# Patient Record
Sex: Female | Born: 2000 | Race: White | Marital: Single | Smoking: Never smoker
Health system: Southern US, Community
[De-identification: ages and names within clinical notes are randomized; demographics above are authoritative.]

## PROBLEM LIST (undated history)

## (undated) DIAGNOSIS — F419 Anxiety disorder, unspecified: Secondary | ICD-10-CM

## (undated) HISTORY — DX: Anxiety disorder, unspecified: F41.9

---
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
## 2020-05-13 DIAGNOSIS — Z20822 Contact with and (suspected) exposure to covid-19: Secondary | ICD-10-CM | POA: Diagnosis not present

## 2020-07-19 ENCOUNTER — Ambulatory Visit: Payer: Self-pay

## 2020-07-19 NOTE — Telephone Encounter (Signed)
Mother reports pt. Is away at college and has been having "dizziness and 2 fainting episodes at school." Not witnessed. Pt. Has refused to seek medical attention at school.Seems to happen with changing positions.No other symptoms. Wants to be seen when she comes home. Appointment made. Instructed Mom to encourage pt. To be seen at school if this happens again. Verbalizes understanding. Reason for Disposition  Simple fainting is a chronic symptom (has occurred multiple times)  Answer Assessment - Initial Assessment Questions 1. ONSET: "How long were you unconscious?" (minutes) "When did it happen?"     Unsure 2. CONTENT: "What happened during period of unconsciousness?" (e.g., seizure activity)     No seizure 3. MENTAL STATUS: "Alert and oriented now?" (oriented x 3 = name, month, location)      Mother not with pt. 4. TRIGGER: "What do you think caused the fainting?" "What were you doing just before you fainted?"  (e.g., exercise, sudden standing up, prolonged standing)     Standing 5. RECURRENT SYMPTOM: "Have you ever passed out before?" If Yes, ask: "When was the last time?" and "What happened that time?"      Yes 6. INJURY: "Did you sustain any injury during the fall?"      No 7. CARDIAC SYMPTOMS: "Have you had any of the following symptoms: chest pain, difficulty breathing, palpitations?"     No 8. NEUROLOGIC SYMPTOMS: "Have you had any of the following symptoms: headache, numbness, vertigo, weakness?"     Dizzy 9. GI SYMPTOMS: "Have you had any of the following symptoms: abdominal pain, vomiting, diarrhea, blood in stools?"     No 10. OTHER SYMPTOMS: "Do you have any other symptoms?"       No 11. PREGNANCY: "Is there any chance you are pregnant?" "When was your last menstrual period?"       No  Protocols used: [facility]

## 2020-07-26 ENCOUNTER — Ambulatory Visit: Payer: BC Managed Care – PPO | Admitting: Physician Assistant

## 2020-07-26 ENCOUNTER — Encounter: Payer: Self-pay | Admitting: Physician Assistant

## 2020-07-26 ENCOUNTER — Other Ambulatory Visit: Payer: Self-pay

## 2020-07-26 VITALS — BP 117/70 | HR 77 | Temp 98.5°F | Resp 16 | Wt 170.4 lb

## 2020-07-26 DIAGNOSIS — R55 Syncope and collapse: Secondary | ICD-10-CM | POA: Diagnosis not present

## 2020-07-26 DIAGNOSIS — Z23 Encounter for immunization: Secondary | ICD-10-CM

## 2020-07-26 DIAGNOSIS — R42 Dizziness and giddiness: Secondary | ICD-10-CM

## 2020-07-26 NOTE — Patient Instructions (Signed)

## 2020-07-26 NOTE — Progress Notes (Signed)
Established patient visit   Patient: Brianna Hardin   DOB: [birth year]   19 y.o. Female  MRN: [id number] Visit Date: 07/26/2020  Today's healthcare provider: Margaretann Loveless, PA-C   No chief complaint on file.  Subjective    HPI  Patient here with c/o fainting. She has had 3 episodes. First one was back in October. Second one was a month ago and the last one was 2 weeks ago. Reports that it has happened when she stands up then she starts to feel lightheaded.   There are no problems to display for this patient.  No past medical history on file.     Medications: Outpatient Medications Prior to Visit  Medication Sig  . Cholecalciferol (VITAMIN D-3 PO) Take by mouth.   No facility-administered medications prior to visit.    Review of Systems  Constitutional: Negative.   Respiratory: Negative.   Cardiovascular: Negative.   Neurological: Positive for syncope and light-headedness.    Last CBC Lab Results  Component Value Date   WBC 6.7 07/02/2019   HGB 13.4 07/02/2019   HCT 40.8 07/02/2019   MCV 92 07/02/2019   MCH 30.0 07/02/2019   RDW 11.9 07/02/2019   PLT 219 07/02/2019   Last metabolic panel Lab Results  Component Value Date   GLUCOSE 85 07/02/2019   NA 142 07/02/2019   K 4.6 07/02/2019   CL 103 07/02/2019   CO2 22 07/02/2019   BUN 19 07/02/2019   CREATININE 0.91 07/02/2019   GFRNONAA 92 07/02/2019   GFRAA 107 07/02/2019   CALCIUM 9.7 07/02/2019   PROT 7.0 07/02/2019   ALBUMIN 4.8 07/02/2019   LABGLOB 2.2 07/02/2019   AGRATIO 2.2 07/02/2019   BILITOT 0.5 07/02/2019   ALKPHOS 68 07/02/2019   AST 16 07/02/2019   ALT 13 07/02/2019      Objective    BP 117/70 (BP Location: Right Arm, Patient Position: Sitting, Cuff Size: Large)   Pulse 77   Temp 98.5 F (36.9 C) (Oral)   Resp 16   Wt 170 lb 6.4 oz (77.3 kg)   BMI 22.48 kg/m  BP Readings from Last 3 Encounters:  07/26/20 117/70  07/02/19 121/71  06/17/18 (!) 110/60 (37 %, Z = -0.33 /   17 %, Z = -0.95)*   *BP percentiles are based on the 2017 AAP Clinical Practice Guideline for girls   Wt Readings from Last 3 Encounters:  07/26/20 170 lb 6.4 oz (77.3 kg) (92 %, Z= 1.42)*  07/02/19 183 lb (83 kg) (96 %, Z= 1.72)*  06/17/18 183 lb 3.2 oz (83.1 kg) (96 %, Z= 1.77)*   * Growth percentiles are based on CDC (Girls, 2-20 Years) data.      Physical Exam Vitals reviewed.  Constitutional:      General: She is not in acute distress.    Appearance: Normal appearance. She is well-developed, normal weight and well-nourished. She is not ill-appearing or diaphoretic.  Cardiovascular:     Rate and Rhythm: Normal rate and regular rhythm.     Heart sounds: Normal heart sounds. No murmur heard. No friction rub. No gallop.   Pulmonary:     Effort: Pulmonary effort is normal. No respiratory distress.     Breath sounds: Normal breath sounds. No wheezing or rales.  Musculoskeletal:     Cervical back: Normal range of motion and neck supple.  Neurological:     General: No focal deficit present.     Mental Status: She is  alert and oriented to person, place, and time. Mental status is at baseline.     Orthostatic VS for the past 24 hrs (Last 3 readings):  BP- Lying Pulse- Lying BP- Sitting Pulse- Sitting BP- Standing at 0 minutes Pulse- Standing at 0 minutes  07/26/20 1439 113/67 69 124/68 80 121/67 85     No results found for any visits on 07/26/20.  Assessment & Plan     1. Syncope, unspecified syncope type Suspect orthostatic hypotension due to mechanism of standing being onset of the 3 syncopal episodes. No orthostasis noted on exam today. Advised to push fluids. Can consider compression stockings while awake. Also discussed changing positions slowly. Will refer to Cardiology for further evaluation and consideration of ruling out any dysautonomia, consult appreciated. Will check labs as below and f/u pending results. - Ambulatory referral to Cardiology - CBC w/Diff/Platelet -  Comprehensive Metabolic Panel (CMET) - TSH  2. Lightheadedness See above medical treatment plan. - Ambulatory referral to Cardiology - CBC w/Diff/Platelet - Comprehensive Metabolic Panel (CMET) - TSH  3. Need for influenza vaccination Flu vaccine already received. Unsure of date.   No follow-ups on file.      Delmer Islam, PA-C, have reviewed all documentation for this visit. The documentation on 07/26/20 for the exam, diagnosis, procedures, and orders are all accurate and complete.   Reine Just  [facility] [phone] (phone) [phone] (fax)  [facility]

## 2020-07-27 LAB — CBC WITH DIFFERENTIAL/PLATELET
Basophils Absolute: 0.1 10*3/uL (ref 0.0–0.2)
Basos: 1 %
EOS (ABSOLUTE): 0.1 10*3/uL (ref 0.0–0.4)
Eos: 1 %
Hematocrit: 41.3 % (ref 34.0–46.6)
Hemoglobin: 13.8 g/dL (ref 11.1–15.9)
Immature Grans (Abs): 0 10*3/uL (ref 0.0–0.1)
Immature Granulocytes: 0 %
Lymphocytes Absolute: 2.5 10*3/uL (ref 0.7–3.1)
Lymphs: 38 %
MCH: 29.7 pg (ref 26.6–33.0)
MCHC: 33.4 g/dL (ref 31.5–35.7)
MCV: 89 fL (ref 79–97)
Monocytes Absolute: 0.5 10*3/uL (ref 0.1–0.9)
Monocytes: 8 %
Neutrophils Absolute: 3.4 10*3/uL (ref 1.4–7.0)
Neutrophils: 52 %
Platelets: 220 10*3/uL (ref 150–450)
RBC: 4.64 x10E6/uL (ref 3.77–5.28)
RDW: 12.2 % (ref 11.7–15.4)
WBC: 6.6 10*3/uL (ref 3.4–10.8)

## 2020-07-27 LAB — COMPREHENSIVE METABOLIC PANEL
ALT: 10 IU/L (ref 0–32)
AST: 16 IU/L (ref 0–40)
Albumin/Globulin Ratio: 2.2 (ref 1.2–2.2)
Albumin: 4.8 g/dL (ref 3.9–5.0)
Alkaline Phosphatase: 72 IU/L (ref 42–106)
BUN/Creatinine Ratio: 13 (ref 9–23)
BUN: 12 mg/dL (ref 6–20)
Bilirubin Total: 0.4 mg/dL (ref 0.0–1.2)
CO2: 21 mmol/L (ref 20–29)
Calcium: 9.4 mg/dL (ref 8.7–10.2)
Chloride: 105 mmol/L (ref 96–106)
Creatinine, Ser: 0.93 mg/dL (ref 0.57–1.00)
GFR calc Af Amer: 103 mL/min/{1.73_m2} (ref >59)
GFR calc non Af Amer: 89 mL/min/{1.73_m2} (ref >59)
Globulin, Total: 2.2 g/dL (ref 1.5–4.5)
Glucose: 82 mg/dL (ref 65–99)
Potassium: 4.8 mmol/L (ref 3.5–5.2)
Sodium: 141 mmol/L (ref 134–144)
Total Protein: 7 g/dL (ref 6.0–8.5)

## 2020-07-27 LAB — TSH: TSH: 1.5 u[IU]/mL (ref 0.450–4.500)

## 2020-07-29 ENCOUNTER — Encounter: Payer: BC Managed Care – PPO | Admitting: Physician Assistant

## 2020-07-29 ENCOUNTER — Ambulatory Visit (INDEPENDENT_AMBULATORY_CARE_PROVIDER_SITE_OTHER): Payer: BC Managed Care – PPO

## 2020-07-29 ENCOUNTER — Other Ambulatory Visit: Payer: Self-pay

## 2020-07-29 ENCOUNTER — Ambulatory Visit: Payer: BC Managed Care – PPO | Admitting: Cardiology

## 2020-07-29 ENCOUNTER — Encounter: Payer: Self-pay | Admitting: Cardiology

## 2020-07-29 VITALS — BP 122/71 | HR 59 | Ht 73.0 in | Wt 170.0 lb

## 2020-07-29 DIAGNOSIS — R55 Syncope and collapse: Secondary | ICD-10-CM | POA: Diagnosis not present

## 2020-07-29 NOTE — Progress Notes (Signed)
Cardiology Office Note:    Date:  07/29/2020   ID:  Brianna Hardin, DOB May 03, 2001, MRN [id number]  PCP:  Margaretann Loveless, PA-C  [facility] Cardiologist:  No primary care provider on file.  [facility] Electrophysiologist:  None   Referring MD: Suella Grove*   Chief Complaint  Patient presents with  . NEW patient-Referred by JenniferBurnette, PA-C    Patient reports syncopal episode about 1 month ago   Brianna Hardin is a 19 y.o. female who is being seen today for the evaluation of syncope at the request of Margaretann Loveless, P*.   History of Present Illness:    Brianna Hardin is a 19 y.o. female with no significant medical history who presents due to syncope.  Episode of syncope occurred about a month ago.  Patient was starting for an exam when she passed out upon standing.  Denies any palpitations, chest pain or shortness of breath.  She states feeling dizzy at the time, tried to grab on to the door but later found herself on the floor.  She denies any prior episodes, denies any recurrent episodes since.  Denies any history of heart disease.  She is pretty active, loves to swim.  History reviewed. No pertinent past medical history.  Past Surgical History:  Procedure Laterality Date  . TONSILLECTOMY  2007    Current Medications: No outpatient medications have been marked as taking for the 07/29/20 encounter (Office Visit) with Debbe Odea, MD.     Allergies:   Patient has no known allergies.   Social History   Socioeconomic History  . Marital status: Single    Spouse name: Not on file  . Number of children: Not on file  . Years of education: Not on file  . Highest education level: Not on file  Occupational History  . Not on file  Tobacco Use  . Smoking status: Never Smoker  . Smokeless tobacco: Never Used  Vaping Use  . Vaping Use: Never used  Substance and Sexual Activity  . Alcohol use: No  . Drug use: No  . Sexual activity:  Never  Other Topics Concern  . Not on file  Social History Narrative  . Not on file   Social Determinants of Health   Financial Resource Strain: Not on file  Food Insecurity: Not on file  Transportation Needs: Not on file  Physical Activity: Not on file  Stress: Not on file  Social Connections: Not on file     Family History: The patient's family history includes Anxiety disorder in her mother; Cancer in her father, maternal grandfather, maternal grandmother, mother, paternal grandfather, and paternal grandmother; Depression in her maternal grandfather, maternal grandmother, mother, and paternal aunt; Diabetes in her father, maternal grandfather, paternal grandfather, paternal grandmother, and paternal uncle; GER disease in her maternal grandmother and paternal grandmother; Heart disease in her maternal grandfather; Hyperlipidemia in her maternal grandfather and maternal grandmother.  ROS:   Please see the history of present illness.     All other systems reviewed and are negative.  EKGs/Labs/Other Studies Reviewed:    The following studies were reviewed today:   EKG:  EKG is  ordered today.  The ekg ordered today demonstrates sinus bradycardia, otherwise normal  Recent Labs: 07/26/2020: ALT 10; BUN 12; Creatinine, Ser 0.93; Hemoglobin 13.8; Platelets 220; Potassium 4.8; Sodium 141; TSH 1.500  Recent Lipid Panel    Component Value Date/Time   CHOL 135 07/02/2019 1033   TRIG 62  07/02/2019 1033   HDL 48 07/02/2019 1033   CHOLHDL 2.8 07/02/2019 1033   LDLCALC 74 07/02/2019 1033     Risk Assessment/Calculations:      Physical Exam:    VS:  BP 122/71 (BP Location: Right Arm, Patient Position: Sitting, Cuff Size: Normal)   Pulse (!) 59   Ht 6\' 1"  (1.854 m)   Wt 170 lb (77.1 kg)   SpO2 99%   BMI 22.43 kg/m     Wt Readings from Last 3 Encounters:  07/29/20 170 lb (77.1 kg) (92 %, Z= 1.41)*  07/26/20 170 lb 6.4 oz (77.3 kg) (92 %, Z= 1.42)*  07/02/19 183 lb (83 kg)  (96 %, Z= 1.72)*   * Growth percentiles are based on CDC (Girls, 2-20 Years) data.     GEN:  Well nourished, well developed in no acute distress HEENT: Normal NECK: No JVD; No carotid bruits LYMPHATICS: No lymphadenopathy CARDIAC: RRR, no murmurs, rubs, gallops RESPIRATORY:  Clear to auscultation without rales, wheezing or rhonchi  ABDOMEN: Soft, non-tender, non-distended MUSCULOSKELETAL:  No edema; No deformity  SKIN: Warm and dry NEUROLOGIC:  Alert and oriented x 3 PSYCHIATRIC:  Normal affect   ASSESSMENT:    1. Syncope and collapse    PLAN:    In order of problems listed above:  1. Patient with one episode of syncope.  Denies prior or recurrent episodes.  No palpitations, chest pain.  Felt dizzy at the time suggesting orthostasis.  Orthostatic vitals in the office today without any evidence for orthostasis.  Cardiac exam normal.  EKG unrevealing.  Will place a cardiac monitor to evaluate any significant arrhythmias.  If this is normal, recommend close monitoring.  She is young, with no cardiac risk factors.  Will call patient after cardiac monitor results are reviewed.  Further management depending on monitor results.     Medication Adjustments/Labs and Tests Ordered: Current medicines are reviewed at length with the patient today.  Concerns regarding medicines are outlined above.  Orders Placed This Encounter  Procedures  . LONG TERM MONITOR (3-14 DAYS)  . EKG 12-Lead   No orders of the defined types were placed in this encounter.   Patient Instructions  Medications:  Your physician recommends that you continue on your current medications as directed. Please refer to the Current Medication list given to you today.   Lab Work: None Ordered If you have labs (blood work) drawn today and your tests are completely normal, you will receive your results only by: 07/04/19 MyChart Message (if you have MyChart) OR . A paper copy in the mail If you have any lab test that is  abnormal or we need to change your treatment, we will call you to review the results.   Testing/Procedures:  Your physician has recommended that you wear a Zio monitor for 2 weeks. This monitor is a medical device that records the heart's electrical activity. Doctors most often use these monitors to diagnose arrhythmias. Arrhythmias are problems with the speed or rhythm of the heartbeat. The monitor is a small device applied to your chest. You can wear one while you do your normal daily activities. While wearing this monitor if you have any symptoms to push the button and record what you felt. Once you have worn this monitor for the period of time provider prescribed (Usually 14 days), you will return the monitor device in the postage paid box. Once it is returned they will download the data collected and provide Marland Kitchen with a  report which the provider will then review and we will call you with those results. Important tips:  1. Avoid showering during the first 24 hours of wearing the monitor. 2. Avoid excessive sweating to help maximize wear time. 3. Do not submerge the device, no hot tubs, and no swimming pools. 4. Keep any lotions or oils away from the patch. 5. After 24 hours you may shower with the patch on. Take brief showers with your back facing the shower head.  6. Do not remove patch once it has been placed because that will interrupt data and decrease adhesive wear time. 7. Push the button when you have any symptoms and write down what you were feeling. 8. Once you have completed wearing your monitor, remove and place into box which has postage paid and place in your outgoing mailbox.  9. If for some reason you have misplaced your box then call our office and we can provide another box and/or mail it off for you.         Follow-Up: At [facility], you and your health needs are our priority.  As part of our continuing mission to provide you with exceptional heart care, we have  created designated Provider Care Teams.  These Care Teams include your primary Cardiologist (physician) and Advanced Practice Providers (APPs -  Physician Assistants and Nurse Practitioners) who all work together to provide you with the care you need, when you need it.  We recommend signing up for the patient portal called "MyChart".  Sign up information is provided on this After Visit Summary.  MyChart is used to connect with patients for Virtual Visits (Telemedicine).  Patients are able to view lab/test results, encounter notes, upcoming appointments, etc.  Non-urgent messages can be sent to your provider as well.   To learn more about what you can do with MyChart, go to [email or web address].    Your next appointment:   Follow up as needed   The format for your next appointment:   In Person  Provider:   Debbe Odea, MD       Signed, Debbe Odea, MD  07/29/2020 12:50 PM    [facility]

## 2020-07-29 NOTE — Patient Instructions (Signed)
Medications:  Your physician recommends that you continue on your current medications as directed. Please refer to the Current Medication list given to you today.   Lab Work: None Ordered If you have labs (blood work) drawn today and your tests are completely normal, you will receive your results only by: Marland Kitchen MyChart Message (if you have MyChart) OR . A paper copy in the mail If you have any lab test that is abnormal or we need to change your treatment, we will call you to review the results.   Testing/Procedures:  Your physician has recommended that you wear a Zio monitor for 2 weeks. This monitor is a medical device that records the heart's electrical activity. Doctors most often use these monitors to diagnose arrhythmias. Arrhythmias are problems with the speed or rhythm of the heartbeat. The monitor is a small device applied to your chest. You can wear one while you do your normal daily activities. While wearing this monitor if you have any symptoms to push the button and record what you felt. Once you have worn this monitor for the period of time provider prescribed (Usually 14 days), you will return the monitor device in the postage paid box. Once it is returned they will download the data collected and provide [place] with a report which the provider will then review and we will call you with those results. Important tips:  1. Avoid showering during the first 24 hours of wearing the monitor. 2. Avoid excessive sweating to help maximize wear time. 3. Do not submerge the device, no hot tubs, and no swimming pools. 4. Keep any lotions or oils away from the patch. 5. After 24 hours you may shower with the patch on. Take brief showers with your back facing the shower head.  6. Do not remove patch once it has been placed because that will interrupt data and decrease adhesive wear time. 7. Push the button when you have any symptoms and write down what you were feeling. 8. Once you have completed  wearing your monitor, remove and place into box which has postage paid and place in your outgoing mailbox.  9. If for some reason you have misplaced your box then call our office and we can provide another box and/or mail it off for you.         Follow-Up: At [facility], you and your health needs are our priority.  As part of our continuing mission to provide you with exceptional heart care, we have created designated Provider Care Teams.  These Care Teams include your primary Cardiologist (physician) and Advanced Practice Providers (APPs -  Physician Assistants and Nurse Practitioners) who all work together to provide you with the care you need, when you need it.  We recommend signing up for the patient portal called "MyChart".  Sign up information is provided on this After Visit Summary.  MyChart is used to connect with patients for Virtual Visits (Telemedicine).  Patients are able to view lab/test results, encounter notes, upcoming appointments, etc.  Non-urgent messages can be sent to your provider as well.   To learn more about what you can do with MyChart, go to [email or web address].    Your next appointment:   Follow up as needed   The format for your next appointment:   In Person  Provider:   Debbe Odea, MD

## 2022-03-07 ENCOUNTER — Other Ambulatory Visit: Payer: Self-pay

## 2022-03-07 ENCOUNTER — Ambulatory Visit
Admission: RE | Admit: 2022-03-07 | Discharge: 2022-03-07 | Disposition: A | Discharge status: Home / Self Care | Payer: BC Managed Care – PPO | Source: Ambulatory Visit | Attending: Emergency Medicine | Admitting: Emergency Medicine

## 2022-03-07 VITALS — BP 110/73 | HR 89 | Temp 98.2°F | Resp 18

## 2022-03-07 DIAGNOSIS — J069 Acute upper respiratory infection, unspecified: Secondary | ICD-10-CM | POA: Diagnosis not present

## 2022-03-07 LAB — POCT RAPID STREP A (OFFICE): Rapid Strep A Screen: NEGATIVE

## 2022-03-07 LAB — POCT INFLUENZA A/B
Influenza A, POC: NEGATIVE
Influenza B, POC: NEGATIVE

## 2022-03-07 NOTE — ED Triage Notes (Signed)
Patient returned to [place] from [place] on 03/04/2022. Patient reports symptoms started last night, 03/06/2022.  Did a home covid test, reported negative. Complains of cough, fever (101), sore throat

## 2022-03-07 NOTE — ED Provider Notes (Signed)
Renaldo Fiddler    CSN: [id number] Arrival date & time: 03/07/22  8588      History   Chief Complaint Chief Complaint  Patient presents with   Chills    Just got back from vacation in [place] on Sunday.  Starting have fever, chills, sore throat late last night.  Home COVID test negative.  Wondering if I have influenza and if so could I be prescribed Xofluza to shorten my symtoms. - Entered by patient    HPI Arlanda E Norell is a 21 y.o. female.   Patient presents with fever, chills, nonproductive cough, bilateral ear pain ,sore throat beginning 1 day ago.  Tolerating food and liquids.  No known sick contacts but recently traveled from [place] via aircraft.  Has attempted use of Aleve which was minimally helpful.  Denies congestion, shortness of breath, wheezing, headaches.  History of a tonsillectomy.  Home COVID test negative  History reviewed. No pertinent past medical history.  There are no problems to display for this patient.   Past Surgical History:  Procedure Laterality Date   TONSILLECTOMY  2007    OB History   No obstetric history on file.      Home Medications    Prior to Admission medications   Not on File    Family History Family History  Problem Relation Age of Onset   Cancer Mother        Skin Cancer   Depression Mother    Anxiety disorder Mother    Diabetes Father        Type2   Cancer Father        skin cancer   Depression Paternal Aunt    Diabetes Paternal Uncle    Depression Maternal Grandmother    Hyperlipidemia Maternal Grandmother    GER disease Maternal Grandmother    Cancer Maternal Grandmother        skin   Cancer Maternal Grandfather    Diabetes Maternal Grandfather    Depression Maternal Grandfather    Heart disease Maternal Grandfather        CAD   Hyperlipidemia Maternal Grandfather    Diabetes Paternal Grandmother    Cancer Paternal Grandmother    GER disease Paternal Grandmother    Cancer Paternal Grandfather         Head and neck   Diabetes Paternal Grandfather     Social History Social History   Tobacco Use   Smoking status: Never   Smokeless tobacco: Never  Vaping Use   Vaping Use: Never used  Substance Use Topics   Alcohol use: No   Drug use: No     Allergies   Patient has no known allergies.   Review of Systems Review of Systems Defer to HPI   Physical Exam Triage Vital Signs ED Triage Vitals  Enc Vitals Group     BP 03/07/22 0944 110/73     Pulse Rate 03/07/22 0944 89     Resp 03/07/22 0944 18     Temp 03/07/22 0944 98.2 F (36.8 C)     Temp Source 03/07/22 0944 Oral     SpO2 03/07/22 0944 96 %     Weight --      Height --      Head Circumference --      Peak Flow --      Pain Score 03/07/22 0941 2     Pain Loc --      Pain Edu? --  Excl. in GC? --    No data found.  Updated Vital Signs BP 110/73 (BP Location: Left Arm)   Pulse 89   Temp 98.2 F (36.8 C) (Oral)   Resp 18   LMP 02/23/2022   SpO2 96%   Visual Acuity Right Eye Distance:   Left Eye Distance:   Bilateral Distance:    Right Eye Near:   Left Eye Near:    Bilateral Near:     Physical Exam Constitutional:      Appearance: Normal appearance.  HENT:     Head: Normocephalic.     Right Ear: Tympanic membrane, ear canal and external ear normal.     Left Ear: Tympanic membrane, ear canal and external ear normal.     Nose: Nose normal.     Mouth/Throat:     Mouth: Mucous membranes are moist.     Pharynx: Posterior oropharyngeal erythema present. No oropharyngeal exudate.  Eyes:     Extraocular Movements: Extraocular movements intact.  Cardiovascular:     Rate and Rhythm: Normal rate and regular rhythm.     Pulses: Normal pulses.     Heart sounds: Normal heart sounds.  Pulmonary:     Effort: Pulmonary effort is normal.     Breath sounds: Normal breath sounds.  Musculoskeletal:     Cervical back: Normal range of motion and neck supple.  Skin:    General: Skin is warm and dry.   Neurological:     Mental Status: She is alert and oriented to person, place, and time. Mental status is at baseline.  Psychiatric:        Mood and Affect: Mood normal.        Behavior: Behavior normal.      UC Treatments / Results  Labs (all labs ordered are listed, but only abnormal results are displayed) Labs Reviewed  CULTURE, GROUP A STREP [facility])  POCT RAPID STREP A (OFFICE)  POCT INFLUENZA A/B    EKG   Radiology No results found.  Procedures Procedures (including critical care time)  Medications Ordered in UC Medications - No data to display  Initial Impression / Assessment and Plan / UC Course  I have reviewed the triage vital signs and the nursing notes.  Pertinent labs & imaging results that were available during my care of the patient were reviewed by me and considered in my medical decision making (see chart for details).  Vital signs are stable patient is nontoxic appearing and in no signs of distress ,rapid strep and flu test are negative, discussed findings with patient, etiology is most likely viral.,  May follow-up with urgent as needed Final Clinical Impressions(s) / UC Diagnoses   Final diagnoses:  Viral URI with cough   Discharge Instructions   None    ED Prescriptions   None    PDMP not reviewed this encounter.   Valinda Hoar, NP 03/07/22 1025

## 2022-03-07 NOTE — Discharge Instructions (Signed)
Your symptoms today are most likely being caused by a virus and should steadily improve in time it can take up to 7 to 10 days before you truly start to see a turnaround however things will get better  Flu and strep test today are negative    You can take Tylenol and/or Ibuprofen as needed for fever reduction and pain relief.   For cough: honey 1/2 to 1 teaspoon (you can dilute the honey in water or another fluid).  You can also use guaifenesin and dextromethorphan for cough. You can use a humidifier for chest congestion and cough.  If you don't have a humidifier, you can sit in the bathroom with the hot shower running.      For sore throat: try warm salt water gargles, cepacol lozenges, throat spray, warm tea or water with lemon/honey, popsicles or ice, or OTC cold relief medicine for throat discomfort.   For congestion: take a daily anti-histamine like Zyrtec, Claritin, and a oral decongestant, such as pseudoephedrine.  You can also use Flonase 1-2 sprays in each nostril daily.   It is important to stay hydrated: drink plenty of fluids (water, gatorade/powerade/pedialyte, juices, or teas) to keep your throat moisturized and help further relieve irritation/discomfort.'

## 2022-03-10 LAB — CULTURE, GROUP A STREP (THRC)

## 2022-03-14 ENCOUNTER — Encounter: Payer: Self-pay | Admitting: Physician Assistant

## 2022-03-14 ENCOUNTER — Ambulatory Visit: Payer: BC Managed Care – PPO | Admitting: Physician Assistant

## 2022-03-14 VITALS — BP 115/76 | HR 78 | Temp 98.1°F | Ht 73.0 in | Wt 184.5 lb

## 2022-03-14 DIAGNOSIS — B309 Viral conjunctivitis, unspecified: Secondary | ICD-10-CM

## 2022-03-14 DIAGNOSIS — B09 Unspecified viral infection characterized by skin and mucous membrane lesions: Secondary | ICD-10-CM | POA: Diagnosis not present

## 2022-03-14 MED ORDER — POLYMYXIN B-TRIMETHOPRIM 10000-0.1 UNIT/ML-% OP SOLN: 1.0000 [drp] | Freq: Four times a day (QID) | OPHTHALMIC | 0 refills | Status: AC

## 2022-03-14 NOTE — Progress Notes (Signed)
     I,[name],acting as a Neurosurgeon for [employer], PA-C.,have documented all relevant documentation on the behalf of [name], PA-C,as directed by  [name], PA-C while in the presence of [name], PA-C.   Established patient visit   Patient: Brianna Hardin   DOB: October 25, 2000   21 y.o. Female  MRN: [id number] Visit Date: 03/14/2022  Today's healthcare provider: [name], PA-C   Cc. Uri symptoms  Subjective     Reports congestion ,headaches, rhinorrhea, starting about a week ago, along with a red and irritated right eye w/ some discharge in the morning. Reports a fever of 101 a few days ago. Feels her symptoms are improving. She noticed a few days after the fever was gone a red rash appeared on her legs. Denies pain, itching. Feels it is improving.    Medications: No outpatient medications prior to visit.   No facility-administered medications prior to visit.    Review of Systems  HENT:  Positive for congestion and rhinorrhea.   Eyes:  Positive for discharge and itching.  Skin:  Positive for rash.  Neurological:  Positive for headaches.      Objective    Blood pressure 115/76, pulse 78, temperature 98.1 F (36.7 C), temperature source Oral, height 6\' 1"  (1.854 m), weight 184 lb 8 oz (83.7 kg), last menstrual period 02/23/2022, SpO2 100 %.   Physical Exam Constitutional:      General: She is awake.     Appearance: She is well-developed.  HENT:     Head: Normocephalic.     Right Ear: Tympanic membrane normal.     Left Ear: Tympanic membrane normal.     Mouth/Throat:     Pharynx: Posterior oropharyngeal erythema present. No oropharyngeal exudate.  Eyes:     Conjunctiva/sclera:     Right eye: Right conjunctiva is injected. No exudate.    Pupils: Pupils are equal, round, and reactive to light.  Cardiovascular:     Rate and Rhythm: Normal rate and regular rhythm.     Heart sounds: Normal heart sounds.  Pulmonary:     Effort: Pulmonary  effort is normal.     Breath sounds: Normal breath sounds.  Skin:    General: Skin is warm.     Comments: B/l inner thighs with a faint lacy-macular rash  Neurological:     Mental Status: She is alert and oriented to person, place, and time.  Psychiatric:        Attention and Perception: Attention normal.        Mood and Affect: Mood normal.        Speech: Speech normal.        Behavior: Behavior is cooperative.     No results found for any visits on 03/14/22.  Assessment & Plan     Viral conjunctivitis Will send polytrim drops in case it worsens but for now manage with warm compresses  2. Viral exanthem Rash is nonspecific asymptomatic appeared after fever/viral illness Advised pt to monitor if progresses/spreads or becomes symptomatic to call office  Return if symptoms worsen or fail to improve.      I, 05/14/22, PA-C have reviewed all documentation for this visit. The documentation on  03/14/2022 for the exam, diagnosis, procedures, and orders are all accurate and complete.  05/14/2022, PA-C [facility] [address] [place], [place], [place] Office: [phone] Fax: [phone]   [facility]

## 2022-07-24 ENCOUNTER — Ambulatory Visit: Payer: BC Managed Care – PPO | Admitting: Physician Assistant

## 2022-07-24 ENCOUNTER — Encounter: Payer: Self-pay | Admitting: Physician Assistant

## 2022-07-24 ENCOUNTER — Ambulatory Visit (INDEPENDENT_AMBULATORY_CARE_PROVIDER_SITE_OTHER): Payer: BC Managed Care – PPO | Admitting: Physician Assistant

## 2022-07-24 VITALS — BP 127/81 | HR 74 | Temp 98.1°F | Ht 74.0 in | Wt 177.4 lb

## 2022-07-24 DIAGNOSIS — Z23 Encounter for immunization: Secondary | ICD-10-CM | POA: Diagnosis not present

## 2022-07-24 DIAGNOSIS — Z0000 Encounter for general adult medical examination without abnormal findings: Secondary | ICD-10-CM | POA: Diagnosis not present

## 2022-07-24 NOTE — Progress Notes (Signed)
I,Connie R Striblin,acting as a Education administrator for [employer], PA-C.,have documented all relevant documentation on the behalf of Mikey Kirschner, PA-C,as directed by  Mikey Kirschner, PA-C while in the presence of Mikey Kirschner, PA-C.  Complete physical exam   Patient: Brianna Hardin   DOB: May 24, 2001   21 y.o. Female  MRN: [id number] Visit Date: 07/24/2022  Today's healthcare provider: Mikey Kirschner, PA-C   Cc. cpe  Subjective    Brianna Hardin is a 21 y.o. female who presents today for a complete physical exam.  She reports consuming a general diet.  Swimming for exercise   She generally feels well. She reports sleeping well. She does not have additional problems to discuss today.   History reviewed. No pertinent past medical history. Past Surgical History:  Procedure Laterality Date   TONSILLECTOMY  2007   Social History   Socioeconomic History   Marital status: Single    Spouse name: Not on file   Number of children: Not on file   Years of education: Not on file   Highest education level: Not on file  Occupational History   Not on file  Tobacco Use   Smoking status: Never   Smokeless tobacco: Never  Vaping Use   Vaping Use: Never used  Substance and Sexual Activity   Alcohol use: No   Drug use: No   Sexual activity: Never    Birth control/protection: None  Other Topics Concern   Not on file  Social History Narrative   Not on file   Social Determinants of Health   Financial Resource Strain: Not on file  Food Insecurity: Not on file  Transportation Needs: Not on file  Physical Activity: Not on file  Stress: Not on file  Social Connections: Not on file  Intimate Partner Violence: Unknown (06/15/2017)   Humiliation, Afraid, Rape, and Kick questionnaire    Fear of Current or Ex-Partner: Patient refused    Emotionally Abused: Patient refused    Physically Abused: Patient refused    Sexually Abused: Patient refused   Family Status  Relation Name Status    Mother  Alive   Father  Alive   Ethlyn Daniels  (Not Specified)   Annamarie Major  (Not Specified)   MGM  (Not Specified)   MGF  (Not Specified)       MI   PGM  (Not Specified)   PGF  (Not Specified)   Family History  Problem Relation Age of Onset   Cancer Mother        Skin Cancer   Depression Mother    Anxiety disorder Mother    Diabetes Father        Type2   Cancer Father        skin cancer   Depression Paternal Aunt    Diabetes Paternal Uncle    Depression Maternal Grandmother    Hyperlipidemia Maternal Grandmother    GER disease Maternal Grandmother    Cancer Maternal Grandmother        skin   Cancer Maternal Grandfather    Diabetes Maternal Grandfather    Depression Maternal Grandfather    Heart disease Maternal Grandfather        CAD   Hyperlipidemia Maternal Grandfather    Diabetes Paternal Grandmother    Cancer Paternal Grandmother    GER disease Paternal Grandmother    Cancer Paternal Grandfather        Head and neck   Diabetes Paternal Grandfather  No Known Allergies  Patient Care Team: Mikey Kirschner, PA-C as PCP - General (Physician Assistant)   Medications: No outpatient medications prior to visit.   No facility-administered medications prior to visit.    Review of Systems  Constitutional:  Negative for fatigue and fever.  Respiratory:  Negative for cough and shortness of breath.   Cardiovascular:  Negative for chest pain and leg swelling.  Gastrointestinal:  Negative for abdominal pain.  Neurological:  Negative for dizziness and headaches.     Objective    Blood pressure 127/81, pulse 74, temperature 98.1 F (36.7 C), temperature source Oral, height _0  (1.88 m), weight 177 lb 6.4 oz (80.5 kg), last menstrual period 07/24/2022, SpO2 99 %.    Physical Exam Constitutional:      General: She is awake.     Appearance: She is well-developed. She is not ill-appearing.  HENT:     Head: Normocephalic.     Right Ear: Tympanic membrane normal.      Left Ear: Tympanic membrane normal.     Nose: Nose normal. No congestion or rhinorrhea.     Mouth/Throat:     Pharynx: No oropharyngeal exudate or posterior oropharyngeal erythema.  Eyes:     Conjunctiva/sclera: Conjunctivae normal.     Pupils: Pupils are equal, round, and reactive to light.  Neck:     Thyroid: No thyroid mass or thyromegaly.  Cardiovascular:     Rate and Rhythm: Normal rate and regular rhythm.     Heart sounds: Normal heart sounds.  Pulmonary:     Effort: Pulmonary effort is normal.     Breath sounds: Normal breath sounds.  Abdominal:     Palpations: Abdomen is soft.     Tenderness: There is no abdominal tenderness.  Musculoskeletal:     Right lower leg: No swelling. No edema.     Left lower leg: No swelling. No edema.  Lymphadenopathy:     Cervical: No cervical adenopathy.  Skin:    General: Skin is warm.  Neurological:     Mental Status: She is alert and oriented to person, place, and time.  Psychiatric:        Attention and Perception: Attention normal.        Mood and Affect: Mood normal.        Speech: Speech normal.        Behavior: Behavior normal. Behavior is cooperative.     Last depression screening scores    07/24/2022    2:02 PM 07/26/2020    1:54 PM 07/02/2019   10:06 AM  PHQ 2/9 Scores  PHQ - 2 Score 0 0 1  PHQ- 9 Score 0  3   Last fall risk screening    07/24/2022    2:02 PM  [place] in the past year? 0  Number falls in past yr: 0  Injury with Fall? 0   Last Audit-C alcohol use screening    07/24/2022    2:02 PM  Alcohol Use Disorder Test (AUDIT)  1. How often do you have a drink containing alcohol? 0  2. How many drinks containing alcohol do you have on a typical day when you are drinking? 0  3. How often do you have six or more drinks on one occasion? 0  AUDIT-C Score 0   A score of 3 or more in women, and 4 or more in men indicates increased risk for alcohol abuse, EXCEPT if all of the points are from  question 1   No results found for any visits on 07/24/22.  Assessment & Plan    Routine Health Maintenance and Physical Exam  Exercise Activities and Dietary recommendations --balanced diet high in fiber and protein, low in sugars, carbs, fats. --physical activity/exercise 30 minutes 3-5 times a week     Immunization History  Administered Date(s) Administered   DTaP 06/28/2001, 08/27/2001, 10/24/2001, 08/05/2002, 11/29/2005   HIB (PRP-OMP) 06/28/2001, 08/27/2001, 10/24/2001, 08/05/2002   HPV 9-valent 01/27/2013, 04/23/2013, 12/03/2013   Hepatitis A 08/19/2009, 10/20/2010   Hepatitis B 06/06/2001, 02/04/2002, 08/05/2002   IPV 06/28/2001, 08/27/2001, 02/04/2002, 11/29/2005   Influenza,inj,Quad PF,6+ Mos 06/15/2017, 04/01/2018, 07/02/2019, 07/24/2022   MMR 04/25/2002, 11/29/2005   Meningococcal B, OMV 06/15/2017, 04/01/2018   Meningococcal Mcv4o 01/27/2013, 06/15/2017   Pneumococcal-Unspecified 06/28/2001, 08/27/2001, 10/24/2001, 04/25/2002   Td 04/01/2018   Tdap 11/20/2011   Varicella 04/25/2002, 11/20/2011    Health Maintenance  Topic Date Due   PAP-Cervical Cytology Screening  07/25/2023 (Originally 04/24/2022)   PAP SMEAR-Modifier  07/25/2023 (Originally 04/24/2022)   Hepatitis C Screening  07/25/2023 (Originally 04/25/2019)   DTaP/Tdap/Td (8 - Td or Tdap) 04/01/2028   INFLUENZA VACCINE  Completed   HPV VACCINES  Completed   HIV Screening  Completed    Discussed health benefits of physical activity, and encouraged her to engage in regular exercise appropriate for her age and condition.  Problem List Items Addressed This Visit   None Visit Diagnoses     Annual physical exam    -  Primary   Relevant Orders   CBC w/Diff/Platelet   Comprehensive Metabolic Panel (CMET)   Lipid Profile   Need for immunization against influenza       Relevant Orders   Flu Vaccine QUAD 47moIM (Fluarix, Fluzone & Alfiuria Quad PF) (Completed)      Deferred pap smear as pt is not  sexually active Explained purpose, would like to do screening by age [age]regardless of sexual activity.   Return in about 1 year (around 07/25/2023) for CPE.     I, LMikey Kirschner PA-C have reviewed all documentation for this visit. The documentation on  07/24/2022 for the exam, diagnosis, procedures, and orders are all accurate and complete.  LMikey Kirschner PA-C [facility] [place] [id number]: [phone]: [phone]

## 2022-07-28 DIAGNOSIS — Z0000 Encounter for general adult medical examination without abnormal findings: Secondary | ICD-10-CM | POA: Diagnosis not present

## 2022-07-29 LAB — COMPREHENSIVE METABOLIC PANEL
ALT: 10 IU/L (ref 0–32)
AST: 15 IU/L (ref 0–40)
Albumin/Globulin Ratio: 2.1 (ref 1.2–2.2)
Albumin: 4.4 g/dL (ref 4.0–5.0)
Alkaline Phosphatase: 69 IU/L (ref 44–121)
BUN/Creatinine Ratio: 12 (ref 9–23)
BUN: 11 mg/dL (ref 6–20)
Bilirubin Total: 0.3 mg/dL (ref 0.0–1.2)
CO2: 24 mmol/L (ref 20–29)
Calcium: 9.4 mg/dL (ref 8.7–10.2)
Chloride: 106 mmol/L (ref 96–106)
Creatinine, Ser: 0.91 mg/dL (ref 0.57–1.00)
Globulin, Total: 2.1 g/dL (ref 1.5–4.5)
Glucose: 88 mg/dL (ref 70–99)
Potassium: 4.9 mmol/L (ref 3.5–5.2)
Sodium: 141 mmol/L (ref 134–144)
Total Protein: 6.5 g/dL (ref 6.0–8.5)
eGFR: 92 mL/min/{1.73_m2} (ref >59)

## 2022-07-29 LAB — CBC WITH DIFFERENTIAL/PLATELET
Basophils Absolute: 0.1 10*3/uL (ref 0.0–0.2)
Basos: 1 %
EOS (ABSOLUTE): 0.1 10*3/uL (ref 0.0–0.4)
Eos: 1 %
Hematocrit: 41.3 % (ref 34.0–46.6)
Hemoglobin: 13.2 g/dL (ref 11.1–15.9)
Immature Grans (Abs): 0 10*3/uL (ref 0.0–0.1)
Immature Granulocytes: 0 %
Lymphocytes Absolute: 2.4 10*3/uL (ref 0.7–3.1)
Lymphs: 44 %
MCH: 29.1 pg (ref 26.6–33.0)
MCHC: 32 g/dL (ref 31.5–35.7)
MCV: 91 fL (ref 79–97)
Monocytes Absolute: 0.3 10*3/uL (ref 0.1–0.9)
Monocytes: 6 %
Neutrophils Absolute: 2.6 10*3/uL (ref 1.4–7.0)
Neutrophils: 48 %
Platelets: 219 10*3/uL (ref 150–450)
RBC: 4.53 x10E6/uL (ref 3.77–5.28)
RDW: 11.8 % (ref 11.7–15.4)
WBC: 5.4 10*3/uL (ref 3.4–10.8)

## 2022-07-29 LAB — LIPID PANEL
Chol/HDL Ratio: 2.4 ratio (ref 0.0–4.4)
Cholesterol, Total: 123 mg/dL (ref 100–199)
HDL: 52 mg/dL (ref >39)
LDL Chol Calc (NIH): 60 mg/dL (ref 0–99)
Triglycerides: 48 mg/dL (ref 0–149)
VLDL Cholesterol Cal: 11 mg/dL (ref 5–40)

## 2022-08-01 NOTE — Progress Notes (Signed)
Labs have been reviewed and are normal and/or stable.  Please let us know if you have any questions.  Thank you, Jackie Russman T Gricel Copen, FNP  [facility] [address] [place], [place] [address] [phone] (phone) [phone] (fax) [facility] 

## 2022-10-16 DIAGNOSIS — Z113 Encounter for screening for infections with a predominantly sexual mode of transmission: Secondary | ICD-10-CM | POA: Diagnosis not present

## 2022-10-16 DIAGNOSIS — N7689 Other specified inflammation of vagina and vulva: Secondary | ICD-10-CM | POA: Diagnosis not present

## 2022-10-16 DIAGNOSIS — Z7251 High risk heterosexual behavior: Secondary | ICD-10-CM | POA: Diagnosis not present

## 2022-12-20 DIAGNOSIS — B3731 Acute candidiasis of vulva and vagina: Secondary | ICD-10-CM | POA: Diagnosis not present

## 2022-12-23 ENCOUNTER — Ambulatory Visit
Admission: RE | Admit: 2022-12-23 | Discharge: 2022-12-23 | Disposition: A | Discharge status: Home / Self Care | Payer: BC Managed Care – PPO | Source: Ambulatory Visit

## 2022-12-23 VITALS — BP 101/73 | HR 69 | Temp 98.4°F | Resp 14 | Wt 180.7 lb

## 2022-12-23 DIAGNOSIS — S61239A Puncture wound without foreign body of unspecified finger without damage to nail, initial encounter: Secondary | ICD-10-CM

## 2022-12-23 DIAGNOSIS — Z203 Contact with and (suspected) exposure to rabies: Secondary | ICD-10-CM

## 2022-12-23 DIAGNOSIS — Z23 Encounter for immunization: Secondary | ICD-10-CM

## 2022-12-23 DIAGNOSIS — W5501XA Bitten by cat, initial encounter: Secondary | ICD-10-CM

## 2022-12-23 MED ORDER — AMOXICILLIN-POT CLAVULANATE 875-125 MG PO TABS: 1.0000 | ORAL_TABLET | Freq: Two times a day (BID) | ORAL | 0 refills | Status: AC

## 2022-12-23 MED ORDER — RABIES VACCINE, PCEC IM SUSR
1.0000 mL | Freq: Once | INTRAMUSCULAR | Status: AC
  Administered 2022-12-23: 1 mL via INTRAMUSCULAR

## 2022-12-23 MED ORDER — RABIES IMMUNE GLOBULIN 150 UNIT/ML IM INJ
20.0000 [IU]/kg | INJECTION | Freq: Once | INTRAMUSCULAR | Status: AC
  Administered 2022-12-23: 1650 [IU]

## 2022-12-23 NOTE — ED Provider Notes (Signed)
[facility]    CSN: [id number] Arrival date & time: 12/23/22  1323      History   Chief Complaint Chief Complaint  Patient presents with   Animal Bite    cat    HPI CHEISEA MURRAH is a 22 y.o. female.   HPI  22 year old female presents for evaluation of a cat bite to her right index finger that she sustained yesterday at her grandmother's house.  She was bitten by a cat that hangs out on her grandmother's property but does not belong to either she or her grandmother.  She is unsure if this cat is vaccinated though it is friendly and able to be patent.  The patient does not have any active bleeding from the wound and she denies any fever or drainage  History reviewed. No pertinent past medical history.  Patient Active Problem List   Diagnosis Date Noted   Acute viral conjunctivitis of right eye 03/14/2022   Viral exanthem 03/14/2022    Past Surgical History:  Procedure Laterality Date   TONSILLECTOMY  2007    OB History   No obstetric history on file.      Home Medications    Prior to Admission medications   Medication Sig Start Date End Date Taking? Authorizing Provider  amoxicillin-clavulanate (AUGMENTIN) 875-125 MG tablet Take 1 tablet by mouth every 12 (twelve) hours for 10 days. 12/23/22 01/02/23 Yes Becky Augusta, NP  fluconazole (DIFLUCAN) 150 MG tablet Take 150 mg by mouth every 3 (three) days. 12/20/22  Yes [provider]    Family History Family History  Problem Relation Age of Onset   Cancer Mother        Skin Cancer   Depression Mother    Anxiety disorder Mother    Diabetes Father        Type2   Cancer Father        skin cancer   Depression Paternal Aunt    Diabetes Paternal Uncle    Depression Maternal Grandmother    Hyperlipidemia Maternal Grandmother    GER disease Maternal Grandmother    Cancer Maternal Grandmother        skin   Cancer Maternal Grandfather    Diabetes Maternal Grandfather    Depression Maternal  Grandfather    Heart disease Maternal Grandfather        CAD   Hyperlipidemia Maternal Grandfather    Diabetes Paternal Grandmother    Cancer Paternal Grandmother    GER disease Paternal Grandmother    Cancer Paternal Grandfather        Head and neck   Diabetes Paternal Grandfather     Social History Social History   Tobacco Use   Smoking status: Never   Smokeless tobacco: Never  Vaping Use   Vaping Use: Never used  Substance Use Topics   Alcohol use: No   Drug use: No     Allergies   Patient has no known allergies.   Review of Systems Review of Systems  Constitutional:  Negative for fever.  Skin:  Positive for wound. Negative for color change.     Physical Exam Triage Vital Signs ED Triage Vitals  Enc Vitals Group     BP      Pulse      Resp      Temp      Temp src      SpO2      Weight      Height  Head Circumference      Peak Flow      Pain Score      Pain Loc      Pain Edu?      Excl. in GC?    No data found.  Updated Vital Signs BP 101/73 (BP Location: Left Arm)   Pulse 69   Temp 98.4 F (36.9 C) (Oral)   Resp 14   Wt 180 lb 11.2 oz (82 kg)   LMP 12/09/2022 (Approximate)   SpO2 96%   BMI 23.20 kg/m   Visual Acuity Right Eye Distance:   Left Eye Distance:   Bilateral Distance:    Right Eye Near:   Left Eye Near:    Bilateral Near:     Physical Exam Vitals and nursing note reviewed.  Constitutional:      Appearance: Normal appearance. She is not ill-appearing.  HENT:     Head: Normocephalic and atraumatic.  Musculoskeletal:        General: Signs of injury present. No swelling, tenderness or deformity.  Skin:    General: Skin is warm and dry.     Capillary Refill: Capillary refill takes less than 2 seconds.     Findings: No erythema.  Neurological:     General: No focal deficit present.     Mental Status: She is alert and oriented to person, place, and time.      UC Treatments / Results  Labs (all labs ordered  are listed, but only abnormal results are displayed) Labs Reviewed - No data to display  EKG   Radiology No results found.  Procedures Procedures (including critical care time)  Medications Ordered in UC Medications  rabies vaccine (RABAVERT) injection 1 mL (has no administration in time range)  rabies immune globulin (HYPERRAB/KEDRAB) injection 1,650 Units (has no administration in time range)    Initial Impression / Assessment and Plan / UC Course  I have reviewed the triage vital signs and the nursing notes.  Pertinent labs & imaging results that were available during my care of the patient were reviewed by me and considered in my medical decision making (see chart for details).   Patient is a pleasant, nontoxic-appearing 22 year old female presenting for evaluation of of a cat bite she sustained to the middle phalanx of her right index finger yesterday.  Patient seen image above, there is broken skin with some surrounding erythema but it is not warm, tender, and there is no drainage.  Given that the cat is feral I will order the rabies immunoglobulin to be administered along with the first rabies vaccination.  I will discharge patient home on Augmentin 875 twice daily for 10 days.  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  I did call the [facility] urgent care and they advised that the urgent cares do not carry the rabies vaccine and she would have to go to the emergency department, or the [facility] department, for subsequent vaccination doses.  Final Clinical Impressions(s) / UC Diagnoses   Final diagnoses:  Puncture wound of finger, initial encounter  Cat bite, initial encounter     Discharge Instructions      Keep the cat bite clean and dry.  Until a scab formed you can keep it covered with a Band-Aid as well.  Please take the Augmentin twice daily with food for  10 days to prevent infection as a result of your cat  bite.  You have received your first dose of rabies vaccine here in the urgent care.  You will need 3 additional doses on day 3, day 7, and day 14.  Which would be May 21, May 25, and June 1.  I have called and spoken to the [facility] urgent care and they report that urgent care is in [place] do not carry the rabies vaccine and you will need to either receive the vaccine in the emergency department, or else contact the [facility] department.     ED Prescriptions     Medication Sig Dispense Auth. Provider   amoxicillin-clavulanate (AUGMENTIN) 875-125 MG tablet Take 1 tablet by mouth every 12 (twelve) hours for 10 days. 20 tablet Becky Augusta, NP      PDMP not reviewed this encounter.   Becky Augusta, NP 12/23/22 1407

## 2022-12-23 NOTE — ED Triage Notes (Signed)
Patient states that she was bit by a cat at her grandmother's house last night.  Patient states that cat bit her right 2nd finger.  Patient has bandaid over the wound at this time.  Patient denies any bleeding at this time. Patient states that this is a cat that comes around that house but is most likely not vaccinated.  Patient is up to dat on her tetanus vaccine.

## 2022-12-23 NOTE — ED Notes (Signed)
Patient tolerated her rabies vaccine and rabies Ig intramuscular injections.  No signs of redness or allergic reaction.

## 2022-12-23 NOTE — Discharge Instructions (Addendum)
Keep the cat bite clean and dry.  Until a scab formed you can keep it covered with a Band-Aid as well.  Please take the Augmentin twice daily with food for 10 days to prevent infection as a result of your cat bite.  You have received your first dose of rabies vaccine here in the urgent care.  You will need 3 additional doses on day 3, day 7, and day 14.  Which would be May 21, May 25, and June 1.  I have called and spoken to the [facility] urgent care and they report that urgent care is in [place] do not carry the rabies vaccine and you will need to either receive the vaccine in the emergency department, or else contact the [facility] department.

## 2023-03-30 ENCOUNTER — Telehealth (INDEPENDENT_AMBULATORY_CARE_PROVIDER_SITE_OTHER): Payer: BC Managed Care – PPO | Admitting: Family Medicine

## 2023-03-30 ENCOUNTER — Encounter: Payer: Self-pay | Admitting: Family Medicine

## 2023-03-30 DIAGNOSIS — F418 Other specified anxiety disorders: Secondary | ICD-10-CM

## 2023-03-30 DIAGNOSIS — F4010 Social phobia, unspecified: Secondary | ICD-10-CM | POA: Insufficient documentation

## 2023-03-30 MED ORDER — ESCITALOPRAM OXALATE 10 MG PO TABS: 10.0000 mg | ORAL_TABLET | Freq: Every day | ORAL | 1 refills | Status: DC

## 2023-03-30 MED ORDER — PROPRANOLOL HCL 10 MG PO TABS: 10.0000 mg | ORAL_TABLET | Freq: Every day | ORAL | 0 refills | Status: DC | PRN

## 2023-03-30 NOTE — Assessment & Plan Note (Signed)
Patient experiences significant anxiety in her day-to-day life, including going to the store and gym, which has been significantly exacerbated by recent presentations.  Her upcoming semesters include even more presentations as well as a debate class.  Will go ahead and start her on escitalopram as noted below.  Prescribed as needed propranolol for patient to use prior to presentations, both as she is ramping up her escitalopram and later if the escitalopram does not significantly reduce her anxiety for presentation specifically.

## 2023-03-30 NOTE — Progress Notes (Signed)
MyChart Video Visit    Virtual Visit via Video Note   This format is felt to be most appropriate for this patient at this time. Physical exam was limited by quality of the video and audio technology used for the visit.   Patient location: Home Provider location: [facility]  I discussed the limitations of evaluation and management by telemedicine and the availability of in person appointments. The patient expressed understanding and agreed to proceed.  Patient: Brianna Hardin   DOB: 2001-02-19   21 y.o. Female  MRN: [id number] Visit Date: 03/30/2023  Today's healthcare provider: Sherlyn Hay, DO   Chief Complaint  Patient presents with   Anxiety   Subjective    Anxiety Patient reports no chest pain, nausea, palpitations or shortness of breath.     HPI   Patient reports having anxiety, worsening for the past few weeks. Reports some panic attacks. Last edited by Marjie Skiff, CMA on 03/30/2023  8:12 AM.      Has never had a panic attacks before - social anxious; not diagnosed  - esp with presenting; hasn't had a lot of presentations since high school  - had to give her first presentation in years last semester       - said a few words, then had to stop and starting crying; she had a partner in that presentation who was able to take over  - took benadryl for several subsequent presentations with helped. Then had a sinus infection and was told not to take benadryl  -  has five presentations upcoming Anxiety is much worse when doing a presentation Going in a grocery store or going to the gym makes her very anxious    Medications: Outpatient Medications Prior to Visit  Medication Sig   [DISCONTINUED] fluconazole (DIFLUCAN) 150 MG tablet Take 150 mg by mouth every 3 (three) days. (Patient not taking: Reported on 03/30/2023)   No facility-administered medications prior to visit.    Review of Systems  Constitutional:  Negative for appetite  change, chills, fatigue and fever.  Respiratory:  Negative for chest tightness and shortness of breath.   Cardiovascular:  Negative for chest pain and palpitations.  Gastrointestinal:  Negative for abdominal pain, nausea and vomiting.        Objective    There were no vitals taken for this visit.      Physical Exam Constitutional:      General: She is not in acute distress.    Appearance: Normal appearance.  HENT:     Head: Normocephalic.  Pulmonary:     Effort: Pulmonary effort is normal. No respiratory distress.  Neurological:     Mental Status: She is alert and oriented to person, place, and time. Mental status is at baseline.  Psychiatric:        Speech: Speech normal.        Behavior: Behavior normal.        Thought Content: Thought content does not include homicidal or suicidal ideation. Thought content does not include homicidal or suicidal plan.        Judgment: Judgment is not impulsive.      Assessment & Plan    Social anxiety disorder Assessment & Plan: Patient experiences significant anxiety in her day-to-day life, including going to the store and gym, which has been significantly exacerbated by recent presentations.  Her upcoming semesters include even more presentations as well as a debate class.  Will go ahead and start her  on escitalopram as noted below.  Prescribed as needed propranolol for patient to use prior to presentations, both as she is ramping up her escitalopram and later if the escitalopram does not significantly reduce her anxiety for presentation specifically.  Orders: -     Escitalopram Oxalate; Take 1 tablet (10 mg total) by mouth daily.  Dispense: 30 tablet; Refill: 1 -     Propranolol HCl; Take 1 tablet (10 mg total) by mouth daily as needed.  Dispense: 10 tablet; Refill: 0  Performance anxiety -     Propranolol HCl; Take 1 tablet (10 mg total) by mouth daily as needed.  Dispense: 10 tablet; Refill: 0   Return in about 4 weeks (around  04/27/2023) for Anxiety.     I discussed the assessment and treatment plan with the patient. The patient was provided an opportunity to ask questions and all were answered. The patient agreed with the plan and demonstrated an understanding of the instructions.   The patient was advised to call back or seek an in-person evaluation if the symptoms worsen or if the condition fails to improve as anticipated.  I provided 13 minutes of virtual-face-to-face time during this encounter.  I discussed the assessment and treatment plan with the patient  The patient was provided an opportunity to ask questions and all were answered. The patient agreed with the plan and demonstrated an understanding of the instructions.   The patient was advised to call back or seek an in-person evaluation if the symptoms worsen or if the condition fails to improve as anticipated.   Sherlyn Hay, DO [facility] [phone] (phone) [phone] (fax)  [facility]

## 2023-08-14 ENCOUNTER — Encounter: Payer: Self-pay | Admitting: Family Medicine

## 2023-08-14 DIAGNOSIS — F418 Other specified anxiety disorders: Secondary | ICD-10-CM

## 2023-08-17 MED ORDER — HYDROXYZINE PAMOATE 25 MG PO CAPS: 25.0000 mg | ORAL_CAPSULE | Freq: Three times a day (TID) | ORAL | 0 refills | Status: DC | PRN

## 2024-08-20 ENCOUNTER — Encounter: Payer: Self-pay | Admitting: Family Medicine

## 2024-08-20 ENCOUNTER — Ambulatory Visit: Admitting: Family Medicine

## 2024-08-20 VITALS — BP 110/71 | HR 66 | Temp 98.3°F | Ht 74.0 in | Wt 210.9 lb

## 2024-08-20 DIAGNOSIS — Z118 Encounter for screening for other infectious and parasitic diseases: Secondary | ICD-10-CM

## 2024-08-20 DIAGNOSIS — Z0001 Encounter for general adult medical examination with abnormal findings: Secondary | ICD-10-CM

## 2024-08-20 DIAGNOSIS — Z13228 Encounter for screening for other metabolic disorders: Secondary | ICD-10-CM

## 2024-08-20 DIAGNOSIS — F4010 Social phobia, unspecified: Secondary | ICD-10-CM | POA: Diagnosis not present

## 2024-08-20 DIAGNOSIS — Z124 Encounter for screening for malignant neoplasm of cervix: Secondary | ICD-10-CM

## 2024-08-20 DIAGNOSIS — Z833 Family history of diabetes mellitus: Secondary | ICD-10-CM | POA: Diagnosis not present

## 2024-08-20 DIAGNOSIS — Z23 Encounter for immunization: Secondary | ICD-10-CM

## 2024-08-20 DIAGNOSIS — Z1159 Encounter for screening for other viral diseases: Secondary | ICD-10-CM

## 2024-08-20 DIAGNOSIS — Z0000 Encounter for general adult medical examination without abnormal findings: Secondary | ICD-10-CM

## 2024-08-20 DIAGNOSIS — Z136 Encounter for screening for cardiovascular disorders: Secondary | ICD-10-CM

## 2024-08-20 NOTE — Patient Instructions (Signed)
 Here are some videos which offer a variety of different workout classes with different levels: [email or web address]  It is completely free. If a full video is too much for you to do, you can do them in bite-sized chunks (just pausing when needed and resuming later in the day).

## 2024-08-20 NOTE — Progress Notes (Signed)
 "    Complete physical exam   Patient: Brianna Hardin   DOB: 08/25/2000   23 y.o. Female  MRN: [id number] Visit Date: 08/20/2024  Today's healthcare provider: LAURAINE LOISE BUOY, DO   Chief Complaint  Patient presents with   Annual Exam    Diet- General Exercise- Was but started new job, wanting to get back to working out Overall feeling- Pretty good Sleep- Sometimes not that great, sometimes wakes up at a specific time  Concerns- None  Flu Vaccine-yes   Subjective    Brianna Hardin is a 24 y.o. female who presents today for a complete physical exam.   HPI HPI     Annual Exam    Additional comments: Diet- General Exercise- Was but started new job, wanting to get back to working out Overall feeling- Pretty good Sleep- Sometimes not that great, sometimes wakes up at a specific time  Concerns- None  Flu Vaccine-yes      Last edited by Terrel Powell CROME, CMA on 08/20/2024  4:30 PM.       Brianna Hardin is a 24 year old female who presents for an annual physical exam.  She has a history of anxiety, which she manages with supplements including ashwagandha, L-theanine, magnesium, and 'kanna'. She has been trying these supplements and thinks they have been working, but she is not currently doing activities like presentations that typically trigger her anxiety.  She experiences back pain, which she attributes to her job that requires standing all day. She has not been exercising regularly due to fatigue after work but wants to resume activities such as rowing and using weights at home. She also enjoys walking in the park behind her house.  She does not consume alcohol regularly, drinking very infrequently, about once a year. She has no known allergies and has not had an eye exam recently. Her menstrual periods are generally regular, with one instance of being late, which she attributes to stress. She has not been sexually active and has not had a Pap smear before, although she  received the HPV vaccine during childhood.    Past Medical History:  Diagnosis Date   Anxiety    Past Surgical History:  Procedure Laterality Date   TONSILLECTOMY  2007   Social History   Socioeconomic History   Marital status: Single    Spouse name: Not on file   Number of children: Not on file   Years of education: Not on file   Highest education level: Not on file  Occupational History   Not on file  Tobacco Use   Smoking status: Never   Smokeless tobacco: Never  Vaping Use   Vaping status: Never Used  Substance and Sexual Activity   Alcohol use: Never   Drug use: Never   Sexual activity: Not Currently    Birth control/protection: None  Other Topics Concern   Not on file  Social History Narrative   Not on file   Social Drivers of Health   Tobacco Use: Low Risk (08/20/2024)   Patient History    Smoking Tobacco Use: Never    Smokeless Tobacco Use: Never    Passive Exposure: Not on file  Financial Resource Strain: Not on file  Food Insecurity: Not on file  Transportation Needs: Not on file  Physical Activity: Not on file  Stress: Not on file  Social Connections: Not on file  Intimate Partner Violence: Unknown (02/13/2023)   [facility] from [facility]   Intimate  Partner Violence    Within the last year, have you been afraid of your partner, ex-partner or family member?: Not on file    Within the last year, have you been humiliated or emotionally abused in other ways by your partner, ex-partner or family member?: Not on file    Within the last year, have you been kicked, hit, slapped, or otherwise physically hurt by your partner, ex-partner or family member?: Not on file    Within the last year, have you been raped or forced to have any kind of sexual activity by your partner, ex-partner or family member?: Not on file  Depression (PHQ2-9): Medium Risk (08/20/2024)   Depression (PHQ2-9)    PHQ-2 Score: 6  Alcohol Screen: Low Risk (08/20/2024)   Alcohol  Screen    Last Alcohol Screening Score (AUDIT): 1  Housing: Not on file  Utilities: Not on file  Health Literacy: Not on file   Family Status  Relation Name Status   Mother  Alive   Father  Alive   Brianna Hardin  (Not Specified)   Brianna Hardin  (Not Specified)   MGM  (Not Specified)   MGF  (Not Specified)       MI   PGM  (Not Specified)   PGF  (Not Specified)  No partnership data on file   Family History  Problem Relation Age of Onset   Cancer Mother        Skin Cancer   Depression Mother    Anxiety disorder Mother    Diabetes Father        Type2   Cancer Father        skin cancer   Depression Paternal Aunt    Diabetes Paternal Uncle    Depression Maternal Grandmother    Hyperlipidemia Maternal Grandmother    GER disease Maternal Grandmother    Cancer Maternal Grandmother        skin   Cancer Maternal Grandfather    Diabetes Maternal Grandfather    Depression Maternal Grandfather    Heart disease Maternal Grandfather        CAD   Hyperlipidemia Maternal Grandfather    Diabetes Paternal Grandmother    Cancer Paternal Grandmother    GER disease Paternal Grandmother    Cancer Paternal Grandfather        Head and neck   Diabetes Paternal Grandfather    Allergies[1]  Patient Care Team: Laquida Cotrell, Lauraine SAILOR, DO as PCP - General (Family Medicine)   Medications: Show/hide medication list[2]  Review of Systems  Constitutional:  Negative for chills, fatigue and fever.  HENT:  Negative for congestion, ear pain, rhinorrhea, sneezing and sore throat.   Eyes: Negative.  Negative for pain and redness.  Respiratory:  Negative for cough, shortness of breath and wheezing.   Cardiovascular:  Negative for chest pain and leg swelling.  Gastrointestinal:  Negative for abdominal pain, blood in stool, constipation, diarrhea and nausea.  Endocrine: Negative for polydipsia and polyphagia.  Genitourinary: Negative.  Negative for dysuria, flank pain, hematuria, pelvic pain, vaginal bleeding  and vaginal discharge.  Musculoskeletal:  Negative for arthralgias, back pain, gait problem and joint swelling.  Skin:  Negative for rash.  Neurological: Negative.  Negative for dizziness, tremors, seizures, weakness, light-headedness, numbness and headaches.  Hematological:  Negative for adenopathy.  Psychiatric/Behavioral: Negative.  Negative for behavioral problems, confusion and dysphoric mood. The patient is not nervous/anxious and is not hyperactive.       Objective    BP  110/71 (BP Location: Right Arm, Patient Position: Sitting, Cuff Size: Normal)   Pulse 66   Temp 98.3 F (36.8 C) (Oral)   Ht 6' 2 (1.88 m)   Wt 210 lb 14.4 oz (95.7 kg)   SpO2 100%   BMI 27.08 kg/m    Physical Exam Vitals and nursing note reviewed.  Constitutional:      General: She is awake.     Appearance: Normal appearance.  HENT:     Head: Normocephalic and atraumatic.     Right Ear: Tympanic membrane, ear canal and external ear normal.     Left Ear: Tympanic membrane, ear canal and external ear normal.     Nose: Nose normal.     Mouth/Throat:     Mouth: Mucous membranes are moist.     Pharynx: Oropharynx is clear. No oropharyngeal exudate or posterior oropharyngeal erythema.  Eyes:     General: No scleral icterus.    Extraocular Movements: Extraocular movements intact.     Conjunctiva/sclera: Conjunctivae normal.     Pupils: Pupils are equal, round, and reactive to light.  Neck:     Thyroid : No thyromegaly or thyroid  tenderness.  Cardiovascular:     Rate and Rhythm: Normal rate and regular rhythm.     Pulses: Normal pulses.     Heart sounds: Normal heart sounds.  Pulmonary:     Effort: Pulmonary effort is normal. No tachypnea, bradypnea or respiratory distress.     Breath sounds: Normal breath sounds. No stridor. No wheezing, rhonchi or rales.  Abdominal:     General: Bowel sounds are normal. There is no distension.     Palpations: Abdomen is soft. There is no mass.     Tenderness:  There is no abdominal tenderness. There is no guarding.     Hernia: No hernia is present.  Musculoskeletal:     Cervical back: Normal range of motion and neck supple.     Right lower leg: No edema.     Left lower leg: No edema.  Lymphadenopathy:     Cervical: No cervical adenopathy.  Skin:    General: Skin is warm and dry.  Neurological:     Mental Status: She is alert and oriented to person, place, and time. Mental status is at baseline.  Psychiatric:        Mood and Affect: Mood normal.        Behavior: Behavior normal.       Last depression screening scores    08/20/2024    4:36 PM 03/30/2023    8:12 AM 07/24/2022    2:02 PM  PHQ 2/9 Scores  PHQ - 2 Score 2 1 0  PHQ- 9 Score 6  0      Data saved with a previous flowsheet row definition   Last fall risk screening    08/20/2024    4:36 PM  Fall Risk   Falls in the past year? 0  Number falls in past yr: 0  Injury with Fall? 0  Risk for fall due to : No Fall Risks   Last Audit-C alcohol use screening    08/20/2024    4:46 PM  Alcohol Use Disorder Test (AUDIT)  1. How often do you have a drink containing alcohol? 1  2. How many drinks containing alcohol do you have on a typical day when you are drinking? 0  3. How often do you have six or more drinks on one occasion? 0  AUDIT-C Score 1  A score of 3 or more in women, and 4 or more in men indicates increased risk for alcohol abuse, EXCEPT if all of the points are from question 1   No results found for any visits on 08/20/24.  Assessment & Plan    Routine Health Maintenance and Physical Exam  Exercise Activities and Dietary recommendations  Goals   None     Immunization History  Administered Date(s) Administered   DTaP 06/28/2001, 08/27/2001, 10/24/2001, 08/05/2002, 11/29/2005   HIB (PRP-OMP) 06/28/2001, 08/27/2001, 10/24/2001, 08/05/2002   HPV 9-valent 01/27/2013, 04/23/2013, 12/03/2013   Hepatitis A 08/19/2009, 10/20/2010   Hepatitis B 06/06/2001,  02/04/2002, 08/05/2002   IPV 06/28/2001, 08/27/2001, 02/04/2002, 11/29/2005   Influenza,inj,Quad PF,6+ Mos 06/15/2017, 04/01/2018, 07/02/2019, 07/24/2022   MMR 04/25/2002, 11/29/2005   Meningococcal B, OMV 06/15/2017, 04/01/2018   Meningococcal Mcv4o 01/27/2013, 06/15/2017   Pneumococcal-Unspecified 06/28/2001, 08/27/2001, 10/24/2001, 04/25/2002   Rabies, IM 12/23/2022   Td 04/01/2018   Tdap 11/20/2011   Varicella 04/25/2002, 11/20/2011    Health Maintenance  Topic Date Due   Hepatitis C Screening  Never done   Cervical Cancer Screening (Pap smear)  Never done   Influenza Vaccine  03/07/2024   COVID-19 Vaccine (1 - 2025-26 season) 04/06/2025 (Originally 04/07/2024)   DTaP/Tdap/Td (8 - Td or Tdap) 04/01/2028   Hepatitis B Vaccines 19-59 Average Risk  Completed   HPV VACCINES  Completed   HIV Screening  Completed   Meningococcal B Vaccine  Completed   Pneumococcal Vaccine  Aged Out    Discussed health benefits of physical activity, and encouraged her to engage in regular exercise appropriate for her age and condition.   Encounter for medical examination to establish care  Social anxiety disorder  Need for hepatitis C screening test -     HCV Ab w Reflex to Quant PCR  Need for influenza vaccination -     Flu vaccine trivalent PF, 6mos and older(Flulaval,Afluria,Fluarix,Fluzone)  Encounter for screening for cardiovascular disorders -     Lipid panel  Screening for endocrine, metabolic and immunity disorder -     Comprehensive metabolic panel with GFR -     Hemoglobin A1c  Family history of diabetes mellitus (DM) -     Hemoglobin A1c     Encounter for medication examination to establish care Annual exam to establish care. Physical exam overall unremarkable except as noted above. Routine lab work ordered as noted.  Regular menstrual cycles with one delayed period likely due to stress. HPV vaccination completed. No current medications or significant allergies. Minimal  alcohol consumption. - Ordered blood work. - Recommended Pap smear. - Recommended eye exam every two years. - Discussed COVID booster vaccine.  Low back pain Recent onset likely due to prolonged standing at work. No acute injury reported. - Provided back stabilization and core strengthening exercises. - Recommended Fernanda to [employer] for exercise.  Social anxiety disorder Managed with ashwagandha, L-theanine, magnesium, and Kanna.    Return in about 6 weeks (around 10/01/2024) for Pap, and in 1 year for CPE w/next provider.     I discussed the assessment and treatment plan with the patient  The patient was provided an opportunity to ask questions and all were answered. The patient agreed with the plan and demonstrated an understanding of the instructions.   The patient was advised to call back or seek an in-person evaluation if the symptoms worsen or if the condition fails to improve as anticipated.    Huxley Shurley N Miroslava Santellan, DO  [facility] [phone] (phone) [phone] (fax)  [facility]     [1] No Known Allergies [2]  Outpatient Medications Prior to Visit  Medication Sig   [DISCONTINUED] escitalopram  (LEXAPRO ) 10 MG tablet Take 1 tablet (10 mg total) by mouth daily.   [DISCONTINUED] hydrOXYzine  (VISTARIL ) 25 MG capsule Take 1-2 capsules (25-50 mg total) by mouth every 8 (eight) hours as needed for anxiety.   [DISCONTINUED] propranolol  (INDERAL ) 10 MG tablet Take 1 tablet (10 mg total) by mouth daily as needed.   No facility-administered medications prior to visit.   "

## 2024-08-21 LAB — HEMOGLOBIN A1C
Est. average glucose Bld gHb Est-mCnc: 100 mg/dL
Hgb A1c MFr Bld: 5.1 % (ref 4.8–5.6)

## 2024-08-21 LAB — COMPREHENSIVE METABOLIC PANEL WITH GFR
ALT: 12 IU/L (ref 0–32)
AST: 19 IU/L (ref 0–40)
Albumin: 4.3 g/dL (ref 4.0–5.0)
Alkaline Phosphatase: 73 IU/L (ref 41–116)
BUN/Creatinine Ratio: 20 (ref 9–23)
BUN: 18 mg/dL (ref 6–20)
Bilirubin Total: 0.2 mg/dL (ref 0.0–1.2)
CO2: 20 mmol/L (ref 20–29)
Calcium: 9.1 mg/dL (ref 8.7–10.2)
Chloride: 106 mmol/L (ref 96–106)
Creatinine, Ser: 0.92 mg/dL (ref 0.57–1.00)
Globulin, Total: 2.1 g/dL (ref 1.5–4.5)
Glucose: 94 mg/dL (ref 70–99)
Potassium: 4.3 mmol/L (ref 3.5–5.2)
Sodium: 139 mmol/L (ref 134–144)
Total Protein: 6.4 g/dL (ref 6.0–8.5)
eGFR: 90 mL/min/1.73

## 2024-08-21 LAB — LIPID PANEL
Chol/HDL Ratio: 2.8 ratio (ref 0.0–4.4)
Cholesterol, Total: 125 mg/dL (ref 100–199)
HDL: 45 mg/dL
LDL Chol Calc (NIH): 63 mg/dL (ref 0–99)
Triglycerides: 85 mg/dL (ref 0–149)
VLDL Cholesterol Cal: 17 mg/dL (ref 5–40)

## 2024-08-21 LAB — HCV AB W REFLEX TO QUANT PCR: HCV Ab: NONREACTIVE

## 2024-08-21 LAB — HCV INTERPRETATION

## 2024-09-09 ENCOUNTER — Ambulatory Visit: Payer: Self-pay | Admitting: Family Medicine

## 2024-10-01 ENCOUNTER — Ambulatory Visit: Admitting: Family Medicine
